# Patient Record
Sex: Female | Born: 1995 | Race: White | Hispanic: No | Marital: Single | Smoking: Never smoker
Health system: Southern US, Community
[De-identification: ages and names within clinical notes are randomized; demographics above are authoritative.]

## PROBLEM LIST (undated history)

## (undated) DIAGNOSIS — N390 Urinary tract infection, site not specified: Secondary | ICD-10-CM

## (undated) DIAGNOSIS — T7840XA Allergy, unspecified, initial encounter: Secondary | ICD-10-CM

## (undated) DIAGNOSIS — Z8489 Family history of other specified conditions: Secondary | ICD-10-CM

## (undated) DIAGNOSIS — H539 Unspecified visual disturbance: Secondary | ICD-10-CM

## (undated) DIAGNOSIS — B019 Varicella without complication: Secondary | ICD-10-CM

## (undated) HISTORY — DX: Unspecified visual disturbance: H53.9

## (undated) HISTORY — PX: ADENOIDECTOMY: SUR15

## (undated) HISTORY — DX: Family history of other specified conditions: Z84.89

## (undated) HISTORY — DX: Allergy, unspecified, initial encounter: T78.40XA

## (undated) HISTORY — DX: Varicella without complication: B01.9

## (undated) HISTORY — PX: TONSILLECTOMY: SUR1361

## (undated) HISTORY — DX: Urinary tract infection, site not specified: N39.0

---
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
## 2017-02-24 DIAGNOSIS — Z23 Encounter for immunization: Secondary | ICD-10-CM | POA: Diagnosis not present

## 2017-12-05 ENCOUNTER — Ambulatory Visit: Payer: 59 | Admitting: Family Medicine

## 2017-12-05 ENCOUNTER — Other Ambulatory Visit: Payer: Self-pay

## 2017-12-05 ENCOUNTER — Encounter: Payer: Self-pay | Admitting: Family Medicine

## 2017-12-05 VITALS — BP 112/70 | HR 79 | Temp 98.0°F | Resp 15 | Ht 69.0 in | Wt 179.4 lb

## 2017-12-05 DIAGNOSIS — H7292 Unspecified perforation of tympanic membrane, left ear: Secondary | ICD-10-CM

## 2017-12-05 DIAGNOSIS — H6690 Otitis media, unspecified, unspecified ear: Secondary | ICD-10-CM | POA: Diagnosis not present

## 2017-12-05 DIAGNOSIS — H6121 Impacted cerumen, right ear: Secondary | ICD-10-CM | POA: Diagnosis not present

## 2017-12-05 MED ORDER — AMOXICILLIN 875 MG PO TABS: 875.0000 mg | ORAL_TABLET | Freq: Two times a day (BID) | ORAL | 0 refills | Status: AC

## 2017-12-05 NOTE — Patient Instructions (Signed)
Please follow up if symptoms do not improve or as needed.   

## 2017-12-05 NOTE — Progress Notes (Signed)
Subjective  CC:  Chief Complaint  Patient presents with  . Ear Pain    Right ear pain, started about 2 weeks ago, worse now, has ear congestion and pressure    HPI: Shannon Chan is a 22 y.o. female who presents to the office today to address the problems listed above in the chief complaint.  Pleasant 22 year old complains of fullness and pain in her right ear off-and-on for the last 2 weeks but worsening.  No drainage.  Mild nasal congestion, no fevers or sore throat.  Leaving for college tomorrow.  No nausea vomiting diarrhea or malaise.  Assessment  1. Acute otitis media, unspecified otitis media type   2. Perforated tympanic membrane, post-infectious, left   3. 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  Treat with amoxicillin twice a day for a week.  Advil and antihistamine as needed.  Follow-up with ENT. Return  No orders of the defined types were placed in this encounter.  Meds ordered this encounter  Medications  . amoxicillin (AMOXIL) 875 MG tablet    Sig: Take 1 tablet (875 mg total) by mouth 2 (two) times daily for 7 days.    Dispense:  14 tablet    Refill:  0      I reviewed the patients updated PMH, FH, and SocHx.    Patient Active Problem List   Diagnosis Date Noted  . Raynaud phenomenon 06/13/2015   Current Meds  Medication Sig  . benzoyl peroxide 5 % gel Apply topically daily.  . Biotin w/ Vitamins C & E (HAIR/SKIN/NAILS PO) Take 1 capsule by mouth daily.  . MULTIPLE VITAMIN PO Take 1 capsule by mouth daily.  . Probiotic Product (PROBIOTIC-10) CAPS Take 1 capsule by mouth daily.    Allergies: Patient is allergic to cephalosporins. Family History: Patient family history is not on file. Social History:  Patient  reports that she has never smoked. She has never used smokeless tobacco. She reports that she does not drink alcohol or use drugs.  Review of  Systems: Constitutional: Negative for fever malaise or anorexia Cardiovascular: negative for chest pain Respiratory: negative for SOB or persistent cough Gastrointestinal: negative for abdominal pain  Objective  Vitals: BP 112/70   Pulse 79   Temp 98 F (36.7 C) (Oral)   Resp 15   Ht 5\' 9"  (1.753 m)   Wt 179 lb 6.4 oz (81.4 kg)   SpO2 99%   BMI 26.49 kg/m  General: no acute distress , A&Ox3 HEENT: PEERL, conjunctiva normal, Oropharynx moist,neck is supple Left TM normal-appearing, right TM obstructed by large amount of wax.  Lighted curette uses to manually extract large cerumen ball without complication.  TM shows chronic perforation with inferior opacity and surrounding erythema, no drainage noted.  External auditory canal normal. Cardiovascular:  RRR without murmur or gallop.  Respiratory:  Good breath sounds bilaterally, CTAB with normal respiratory effort Skin:  Warm, no rashes Ceruminosis is noted.  Wax is removed by curette and manual debridement. Wax impaired visualization of membrane. Wax is brown and hard. Instructions for home care to prevent wax buildup are given.     Commons side effects, risks, benefits, and alternatives for medications and treatment plan prescribed today were discussed, and the patient expressed understanding of the given instructions. Patient is instructed to call or message via MyChart if he/she has any questions or concerns regarding our treatment plan.  No barriers to understanding were identified. We discussed Red Flag symptoms and signs in detail. Patient expressed understanding regarding what to do in case of urgent or emergency type symptoms.   Medication list was reconciled, printed and provided to the patient in AVS. Patient instructions and summary information was reviewed with the patient as documented in the AVS. This note was prepared with assistance of Dragon voice recognition software. Occasional wrong-word or sound-a-like substitutions  may have occurred due to the inherent limitations of voice recognition software

## 2018-03-20 ENCOUNTER — Encounter: Payer: Self-pay | Admitting: Family Medicine

## 2018-03-20 ENCOUNTER — Ambulatory Visit (INDEPENDENT_AMBULATORY_CARE_PROVIDER_SITE_OTHER): Payer: BLUE CROSS/BLUE SHIELD | Admitting: Family Medicine

## 2018-03-20 VITALS — BP 120/70 | HR 75 | Temp 98.2°F | Ht 69.0 in | Wt 185.5 lb

## 2018-03-20 DIAGNOSIS — N6310 Unspecified lump in the right breast, unspecified quadrant: Secondary | ICD-10-CM | POA: Diagnosis not present

## 2018-03-20 NOTE — Progress Notes (Addendum)
0 Patient ID: Shannon Chan, female    DOB: 1996-02-20  Age: 22 y.o. MRN: [id number]    Subjective:  Subjective  HPI Shannon Chan presents for lump palpated in R breast.   Non tender   Review of Systems  Constitutional: Negative for appetite change, diaphoresis, fatigue and unexpected weight change.  Eyes: Negative for pain, redness and visual disturbance.  Respiratory: Negative for cough, chest tightness, shortness of breath and wheezing.   Cardiovascular: Negative for chest pain, palpitations and leg swelling.  Endocrine: Negative for cold intolerance, heat intolerance, polydipsia, polyphagia and polyuria.  Genitourinary: Negative for difficulty urinating, dysuria and frequency.  Neurological: Negative for dizziness, light-headedness, numbness and headaches.    History Past Medical History:  Diagnosis Date  . Allergy   . Family history of adverse reaction to anesthesia   . Urinary tract infection   . Varicella   . Vision abnormalities    Wears glasses    She has a past surgical history that includes Adenoidectomy and Tonsillectomy.   Her family history is not on file.She reports that she has never smoked. She has never used smokeless tobacco. She reports that she does not drink alcohol or use drugs.  Current Outpatient Medications on File Prior to Visit  Medication Sig Dispense Refill  . benzoyl peroxide 5 % gel Apply topically daily.    . Biotin w/ Vitamins C & E (HAIR/SKIN/NAILS PO) Take 1 capsule by mouth daily.    . drospirenone-ethinyl estradiol (YAZ,GIANVI,LORYNA) 3-0.02 MG tablet TK 1 T PO  QD  2  . MULTIPLE VITAMIN PO Take 1 capsule by mouth daily.    . Probiotic Product (PROBIOTIC-10) CAPS Take 1 capsule by mouth daily.     No current facility-administered medications on file prior to visit.      Objective:  Objective  Physical Exam Vitals signs and nursing note reviewed.  Constitutional:      Appearance: She is well-developed and well-nourished.    HENT:     Head: Normocephalic and atraumatic.  Neck:     Vascular: No carotid bruit.  Pulmonary:     Effort: Pulmonary effort is normal. No respiratory distress.  Chest:     Breasts:        Right: Mass present. No inverted nipple, nipple discharge, skin change or tenderness.        Left: No inverted nipple, mass, nipple discharge, skin change or tenderness.    Musculoskeletal:        General: No edema.  Neurological:     Mental Status: She is alert and oriented to person, place, and time.  Psychiatric:        Mood and Affect: Mood and affect normal.    BP 120/70 (BP Location: Left Arm, Patient Position: Sitting, Cuff Size: Normal)   Pulse 75   Temp 98.2 F (36.8 C) (Oral)   Ht 5\' 9"  (1.753 m)   Wt 185 lb 8 oz (84.1 kg)   LMP 03/11/2018   SpO2 99%   BMI 27.39 kg/m  Wt Readings from Last 3 Encounters:  03/20/18 185 lb 8 oz (84.1 kg)  12/05/17 179 lb 6.4 oz (81.4 kg) (94 %, Z= 1.58)*  03/07/16 194 lb 6 oz (88.2 kg) (97 %, Z= 1.90)*   * Growth percentiles are based on CDC (Girls, 2-20 Years) data.     Lab Results  Component Value Date   WBC 6.0 06/13/2015   HGB 11.3 (L) 06/13/2015   HCT 34.3 (L) 06/13/2015  PLT 406.0 06/13/2015   GLUCOSE 86 06/13/2015   NA 138 06/13/2015   K 3.8 06/13/2015   CL 106 06/13/2015   CREATININE 0.67 06/13/2015   BUN 8 06/13/2015   CO2 25 06/13/2015   TSH 2.86 06/13/2015    [place] Abdomen Complete  Result Date: 12/09/2011 *RADIOLOGY REPORT* Clinical Data:  Left upper abdominal and flank pain. COMPLETE ABDOMINAL ULTRASOUND Comparison:  None. Findings: Gallbladder:  No shadowing gallstones or echogenic sludge.  No gallbladder wall thickening or pericholecystic fluid.  Negative sonographic Murphy's sign according to the ultrasound technologist. Common bile duct:  3.8 mm diameter, unremarkable Liver:  No focal lesion identified.  Within normal limits in parenchymal echogenicity. IVC:  Appears normal. Pancreas:  No focal abnormality seen.  Spleen:  8.5 cm craniocaudal length, unremarkable Right Kidney:  10.9 cm. No hydronephrosis.  Well-preserved cortex. Normal size and parenchymal echotexture without focal abnormalities. Left Kidney:  10.7 cm. No hydronephrosis.  Well-preserved cortex. Normal size and parenchymal echotexture without focal abnormalities. Abdominal aorta:  No aneurysm identified. IMPRESSION: Negative.  Normal gallbladder. Original Report Authenticated By: Shannon Chan. DANIEL HASSELL III, M.D.     Assessment & Plan:  Plan  I am having Erva L. Vanyo maintain her benzoyl peroxide, MULTIPLE VITAMIN PO, PROBIOTIC-10, Biotin w/ Vitamins C & E (HAIR/SKIN/NAILS PO), and drospirenone-ethinyl estradiol.  No orders of the defined types were placed in this encounter.   Problem List Items Addressed This Visit    None    Visit Diagnoses    Mass of right breast    -  Primary   Relevant Orders   MM Digital Diagnostic Bilat   US BREAST COMPLETE UNI RIGHT INC AXILLA    f/u prn  Pt was concerned and mother was a nurse and was very concerned because there is a lot of breast cancer in the family  Pt would like to take prescription to college to have mammogram done there Donato SchultzYvonne R Lowne Chase, DO

## 2018-03-20 NOTE — Patient Instructions (Signed)
Mammogram order was printed for you to have when you go back to college

## 2018-03-23 ENCOUNTER — Telehealth: Payer: Self-pay

## 2018-03-23 NOTE — Telephone Encounter (Signed)
Copied from CRM [id number]. Topic: Quick Conservator, museum/galleryCommunication - Office Called Patient (Clinic Use ONLY) >> Mar 23, 2018  9:48 AM Caryl BisLea, Erica M wrote: Reason for CRM: 12/2- LM asking pt to call back to give us the location she would like the mammo and US sent or would she like to wait until she comes back home for Christmas break.Marland Kitchen.ELEA >> Mar 23, 2018 10:08 AM Trula SladeWalter, Linda F wrote: Patient left the information for the [facility] Ph: [phone]  Fax: [phone].  Please send the referral over and the patient can then make the appt for the procedures.

## 2018-03-24 ENCOUNTER — Encounter: Payer: Self-pay | Admitting: Family Medicine

## 2018-03-24 ENCOUNTER — Telehealth: Payer: Self-pay | Admitting: Family Medicine

## 2018-03-24 DIAGNOSIS — N6310 Unspecified lump in the right breast, unspecified quadrant: Secondary | ICD-10-CM

## 2018-03-24 DIAGNOSIS — N6315 Unspecified lump in the right breast, overlapping quadrants: Secondary | ICD-10-CM

## 2018-03-24 NOTE — Telephone Encounter (Signed)
Please change order  

## 2018-03-24 NOTE — Telephone Encounter (Signed)
[facility].Orders were placed for the pt to have a US Breast Comp.   [facility] need [facility] order changed to Diagnostic breast ultrasound bilateral. Could you please change these in Epic for Her appt is tomorrow at 2pm. Please advise and please fax the order to [phone].

## 2018-03-25 DIAGNOSIS — N6313 Unspecified lump in the right breast, lower outer quadrant: Secondary | ICD-10-CM | POA: Diagnosis not present

## 2018-03-25 NOTE — Telephone Encounter (Signed)
Shannon Chan is calling with a  Request to have to orders placed for the patient before lunch. The patient has a appointment 1pm. Please advise.   Direct Contact number: [phone] [facility] fax number  [phone]

## 2018-03-25 NOTE — Addendum Note (Signed)
Addended by: Thelma BargeICHARDSON, Tarrin Menn D on: 03/25/2018 11:08 AM   Modules accepted: Orders

## 2018-03-25 NOTE — Telephone Encounter (Signed)
[facility] from [facility] called again and stated that they need order to say diagnostic mammogram bilateral with US breast bilateral and biopsy if needed.  Order entered again and [facility]ed to [facility].

## 2018-03-25 NOTE — Telephone Encounter (Signed)
Called office and they stated that they need the order to say Diagnostic Breast US bilateral and BX if needed.  Order faxed over to radiology.

## 2018-04-08 ENCOUNTER — Ambulatory Visit (INDEPENDENT_AMBULATORY_CARE_PROVIDER_SITE_OTHER): Payer: BLUE CROSS/BLUE SHIELD | Admitting: Family Medicine

## 2018-04-08 ENCOUNTER — Other Ambulatory Visit: Payer: Self-pay

## 2018-04-08 ENCOUNTER — Encounter: Payer: Self-pay | Admitting: Family Medicine

## 2018-04-08 VITALS — BP 106/72 | HR 68 | Temp 98.2°F | Resp 16 | Ht 69.0 in | Wt 183.2 lb

## 2018-04-08 DIAGNOSIS — E663 Overweight: Secondary | ICD-10-CM | POA: Diagnosis not present

## 2018-04-08 DIAGNOSIS — Z0000 Encounter for general adult medical examination without abnormal findings: Secondary | ICD-10-CM

## 2018-04-08 DIAGNOSIS — Z23 Encounter for immunization: Secondary | ICD-10-CM

## 2018-04-08 LAB — HEPATIC FUNCTION PANEL
ALK PHOS: 35 U/L — AB (ref 39–117)
ALT: 14 U/L (ref 0–35)
AST: 15 U/L (ref 0–37)
Albumin: 4.1 g/dL (ref 3.5–5.2)
Bilirubin, Direct: 0.1 mg/dL (ref 0.0–0.3)
Total Bilirubin: 0.3 mg/dL (ref 0.2–1.2)
Total Protein: 7 g/dL (ref 6.0–8.3)

## 2018-04-08 LAB — BASIC METABOLIC PANEL
BUN: 13 mg/dL (ref 6–23)
CO2: 25 mEq/L (ref 19–32)
Calcium: 9.3 mg/dL (ref 8.4–10.5)
Chloride: 106 mEq/L (ref 96–112)
Creatinine, Ser: 0.81 mg/dL (ref 0.40–1.20)
GFR: 95.57 mL/min (ref >60.00)
Glucose, Bld: 82 mg/dL (ref 70–99)
Potassium: 4 mEq/L (ref 3.5–5.1)
Sodium: 139 mEq/L (ref 135–145)

## 2018-04-08 LAB — LIPID PANEL
Cholesterol: 147 mg/dL (ref 0–200)
HDL: 55.6 mg/dL (ref >39.00)
LDL Cholesterol: 79 mg/dL (ref 0–99)
NonHDL: 91.14
Total CHOL/HDL Ratio: 3
Triglycerides: 60 mg/dL (ref 0.0–149.0)
VLDL: 12 mg/dL (ref 0.0–40.0)

## 2018-04-08 LAB — CBC WITH DIFFERENTIAL/PLATELET
Basophils Absolute: 0.1 10*3/uL (ref 0.0–0.1)
Basophils Relative: 0.5 % (ref 0.0–3.0)
Eosinophils Absolute: 0 10*3/uL (ref 0.0–0.7)
Eosinophils Relative: 0.3 % (ref 0.0–5.0)
HCT: 39.6 % (ref 36.0–46.0)
HEMOGLOBIN: 13.3 g/dL (ref 12.0–15.0)
Lymphocytes Relative: 28.8 % (ref 12.0–46.0)
Lymphs Abs: 3 10*3/uL (ref 0.7–4.0)
MCHC: 33.7 g/dL (ref 30.0–36.0)
MCV: 89.5 fl (ref 78.0–100.0)
Monocytes Absolute: 0.8 10*3/uL (ref 0.1–1.0)
Monocytes Relative: 7.3 % (ref 3.0–12.0)
Neutro Abs: 6.5 10*3/uL (ref 1.4–7.7)
Neutrophils Relative %: 63.1 % (ref 43.0–77.0)
Platelets: 375 10*3/uL (ref 150.0–400.0)
RBC: 4.43 Mil/uL (ref 3.87–5.11)
RDW: 13 % (ref 11.5–14.6)
WBC: 10.3 10*3/uL (ref 4.5–10.5)

## 2018-04-08 LAB — TSH: TSH: 1.73 u[IU]/mL (ref 0.35–5.50)

## 2018-04-08 NOTE — Assessment & Plan Note (Signed)
Pt's PE WNL w/ exception of being overweight.  UTD on immunizations since flu given today.  Check labs as baseline.  Anticipatory guidance provided.

## 2018-04-08 NOTE — Patient Instructions (Signed)
Follow up in 1 year or as needed We'll notify you of your lab results and make any changes if needed Keep up the good work on healthy diet and regular exercise- you look great! Congrats on a good semester! Call with any questions or concerns Happy Holidays!!!

## 2018-04-08 NOTE — Progress Notes (Signed)
   Subjective:    Patient ID: Shannon Chan, female    DOB: Sep 13, 1995, 22 y.o.   MRN: [id number]  HPI CPE- UTD on Tdap, due for flu.  No concerns today.  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  Dating.  Safe in relationship.  No concerns for STIs.  No ETOH or drug use.  No tobacco.   Review of Systems Patient reports no vision/ hearing changes, adenopathy,fever, weight change,  persistant/recurrent hoarseness , swallowing issues, chest pain, palpitations, edema, persistant/recurrent cough, hemoptysis, dyspnea (rest/exertional/paroxysmal nocturnal), gastrointestinal bleeding (melena, rectal bleeding), abdominal pain, significant heartburn, bowel changes, GU symptoms (dysuria, hematuria, incontinence), Gyn symptoms (abnormal  bleeding, pain),  syncope, focal weakness, memory loss, numbness & tingling, skin/hair/nail changes, abnormal bruising or bleeding, anxiety, or depression.     Objective:   Physical Exam General Appearance:    Alert, cooperative, no distress, appears stated age  Head:    Normocephalic, without obvious abnormality, atraumatic  Eyes:    PERRL, conjunctiva/corneas clear, EOM's intact, fundi    benign, both eyes  Ears:    Normal TM's and external ear canals, both ears  Nose:   Nares normal, septum midline, mucosa normal, no drainage    or sinus tenderness  Throat:   Lips, mucosa, and tongue normal; teeth and gums normal  Neck:   Supple, symmetrical, trachea midline, no adenopathy;    Thyroid: no enlargement/tenderness/nodules  Back:     Symmetric, no curvature, ROM normal, no CVA tenderness  Lungs:     Clear to auscultation bilaterally, respirations unlabored  Chest Wall:    No tenderness or deformity   Heart:    Regular rate and rhythm, S1 and S2 normal, no murmur, rub   or gallop  Breast Exam:    Deferred to GYN  Abdomen:     Soft, non-tender, bowel sounds active all four quadrants,    no masses, no organomegaly  Genitalia:     Deferred to GYN  Rectal:    Extremities:   Extremities normal, atraumatic, no cyanosis or edema  Pulses:   2+ and symmetric all extremities  Skin:   Skin color, texture, turgor normal, no rashes or lesions  Lymph nodes:   Cervical, supraclavicular, and axillary nodes normal  Neurologic:   CNII-XII intact, normal strength, sensation and reflexes    throughout          Assessment & Plan:

## 2018-04-09 ENCOUNTER — Encounter: Payer: Self-pay | Admitting: General Practice

## 2018-04-13 ENCOUNTER — Encounter: Payer: Self-pay | Admitting: Family Medicine

## 2018-04-13 DIAGNOSIS — L710 Perioral dermatitis: Secondary | ICD-10-CM | POA: Diagnosis not present

## 2018-04-13 DIAGNOSIS — L700 Acne vulgaris: Secondary | ICD-10-CM | POA: Diagnosis not present

## 2018-04-13 MED ORDER — DROSPIRENONE-ETHINYL ESTRADIOL 3-0.02 MG PO TABS: ORAL_TABLET | ORAL | 1 refills | Status: DC

## 2018-06-20 DIAGNOSIS — M67929 Unspecified disorder of synovium and tendon, unspecified upper arm: Secondary | ICD-10-CM | POA: Diagnosis not present

## 2018-06-20 DIAGNOSIS — M67921 Unspecified disorder of synovium and tendon, right upper arm: Secondary | ICD-10-CM | POA: Diagnosis not present

## 2018-09-30 DIAGNOSIS — L2089 Other atopic dermatitis: Secondary | ICD-10-CM | POA: Diagnosis not present

## 2018-09-30 DIAGNOSIS — D485 Neoplasm of uncertain behavior of skin: Secondary | ICD-10-CM | POA: Diagnosis not present

## 2018-09-30 DIAGNOSIS — L905 Scar conditions and fibrosis of skin: Secondary | ICD-10-CM | POA: Diagnosis not present

## 2018-10-08 ENCOUNTER — Other Ambulatory Visit: Payer: Self-pay | Admitting: Family Medicine

## 2018-10-22 ENCOUNTER — Telehealth: Payer: Self-pay | Admitting: Family Medicine

## 2018-10-22 NOTE — Telephone Encounter (Signed)
Ok for in-office visit if passing screening just in case pelvic exam is warranted.

## 2018-10-22 NOTE — Telephone Encounter (Signed)
Please advise on an in office visit or a VV    Copied from [facility] [id number]. Topic: Appointment Scheduling - Scheduling Inquiry for Clinic >> Oct 21, 2018  4:11 PM Celene Kras A wrote: Reason for CRM: Pt states she experienced some irregular period bleeding mid cycle. Pt states she would like a pelvic exam. Please advise.

## 2018-10-26 NOTE — Telephone Encounter (Signed)
Patient passed screening. Appointment has been scheduled.

## 2018-10-27 ENCOUNTER — Other Ambulatory Visit (HOSPITAL_COMMUNITY)
Admission: RE | Admit: 2018-10-27 | Discharge: 2018-10-27 | Disposition: A | Discharge status: Home / Self Care | Payer: BC Managed Care – PPO | Source: Ambulatory Visit | Attending: Family Medicine | Admitting: Family Medicine

## 2018-10-27 ENCOUNTER — Other Ambulatory Visit: Payer: Self-pay

## 2018-10-27 ENCOUNTER — Encounter: Payer: Self-pay | Admitting: Family Medicine

## 2018-10-27 ENCOUNTER — Ambulatory Visit (INDEPENDENT_AMBULATORY_CARE_PROVIDER_SITE_OTHER): Payer: BC Managed Care – PPO | Admitting: Family Medicine

## 2018-10-27 VITALS — BP 107/78 | HR 103 | Temp 98.0°F | Resp 16 | Ht 69.0 in | Wt 167.0 lb

## 2018-10-27 DIAGNOSIS — H938X1 Other specified disorders of right ear: Secondary | ICD-10-CM | POA: Diagnosis not present

## 2018-10-27 DIAGNOSIS — N923 Ovulation bleeding: Secondary | ICD-10-CM

## 2018-10-27 LAB — POCT URINALYSIS DIPSTICK
Bilirubin, UA: NEGATIVE
Blood, UA: NEGATIVE
Glucose, UA: NEGATIVE
Leukocytes, UA: NEGATIVE
Nitrite, UA: NEGATIVE
Protein, UA: POSITIVE — AB
Spec Grav, UA: 1.02 (ref 1.010–1.025)
Urobilinogen, UA: 0.2 E.U./dL
pH, UA: 6 (ref 5.0–8.0)

## 2018-10-27 LAB — POCT URINE PREGNANCY: Preg Test, Ur: NEGATIVE

## 2018-10-27 NOTE — Progress Notes (Signed)
   Subjective:    Patient ID: Shannon Chan, female    DOB: [birth year], 23 y.o.   MRN: [id number]  HPI Vaginal concerns- pt reports she has 'very regular' periods but this last cycle she had 5 days of mid cycle bleeding w/ severe cramping.  Continues to take her Yaz daily.  Was discussing this w/ a friend and friend mentioned similar sxs that resulted in + STD screening.  Pt is currently sexually active.  Not using condoms.  Not missing any pills.  LMP 7/1.  Mid cycle bleeding was 6/17-21.  Denies any changes to medications or stress level at that time but does think she had yeast prior to bleeding.  Currently asymptomatic- no pain or discharge.  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  Has had 'congestion' and 'fullness' [place].  Denies pain or drainage.   Review of Systems For ROS see HPI     Objective:   Physical Exam Vitals signs reviewed.  Constitutional:      General: She is not in acute distress.    Appearance: Normal appearance. She is normal weight. She is not ill-appearing.  HENT:     Head: Normocephalic and atraumatic.     Right Ear: Ear canal and external ear normal.     Ears:     Comments: R TM w/ medial perforation, no obvious infxn Abdominal:     General: Abdomen is flat. Bowel sounds are normal. There is no distension.     Palpations: Abdomen is soft.     Tenderness: There is no abdominal tenderness. There is no guarding.  Genitourinary:    Comments: Deferred at pt's request Skin:    General: Skin is warm and dry.  Neurological:     General: No focal deficit present.     Mental Status: She is alert and oriented to person, place, and time.  Psychiatric:        Mood and Affect: Mood normal.        Behavior: Behavior normal.        Thought Content: Thought content normal.           Assessment & Plan:  Vaginal bleeding- new.  Pt is very concerned about her mid-menstrual bleeding as she  is typically very regular.  Had normal period on schedule.  Denies missing or skipping pills.  Currently asymptomatic.  Will check for STDs as pt is concerned about this.  Discussed impact of stress, weight gain/loss, medications on menstrual cycles.  Pt to continue to monitor her cycles.  If again has abnormal bleeding will do pelvic exam and/or refer to GYN.  Pt expressed understanding and is in agreement w/ plan.   Ear fullness- no evidence of infxn.  Reviewed treatment plan- pt to start wearing ear plugs and avoid putting anything in the ear.

## 2018-10-27 NOTE — Patient Instructions (Signed)
Follow up as needed We'll notify you of your lab results and make any changes if needed Continue to monitor your periods and let me know if there is again any irregular bleeding Continue your daily birth control pills Make sure you are wearing condoms to protect yourself Do NOT put any alcohol in your ear Wear ear plugs when you swim Call with any questions or concerns Stay safe!

## 2018-10-28 LAB — URINE CYTOLOGY ANCILLARY ONLY
Chlamydia: NEGATIVE
Neisseria Gonorrhea: NEGATIVE
Trichomonas: NEGATIVE

## 2018-10-29 LAB — URINE CYTOLOGY ANCILLARY ONLY
Bacterial vaginitis: NEGATIVE
Candida vaginitis: NEGATIVE

## 2018-11-16 ENCOUNTER — Encounter: Payer: Self-pay | Admitting: Family Medicine

## 2018-11-17 ENCOUNTER — Other Ambulatory Visit: Payer: Self-pay | Admitting: Family Medicine

## 2018-11-17 DIAGNOSIS — Z01419 Encounter for gynecological examination (general) (routine) without abnormal findings: Secondary | ICD-10-CM

## 2018-12-02 ENCOUNTER — Encounter: Payer: Self-pay | Admitting: Family Medicine

## 2018-12-04 MED ORDER — NORGESTIMATE-ETH ESTRADIOL 0.25-35 MG-MCG PO TABS: 1.0000 | ORAL_TABLET | Freq: Every day | ORAL | 11 refills | Status: DC

## 2019-01-07 ENCOUNTER — Encounter: Payer: Self-pay | Admitting: Family Medicine

## 2019-01-27 DIAGNOSIS — F4323 Adjustment disorder with mixed anxiety and depressed mood: Secondary | ICD-10-CM | POA: Diagnosis not present

## 2019-02-03 DIAGNOSIS — F4323 Adjustment disorder with mixed anxiety and depressed mood: Secondary | ICD-10-CM | POA: Diagnosis not present

## 2019-02-10 DIAGNOSIS — F4323 Adjustment disorder with mixed anxiety and depressed mood: Secondary | ICD-10-CM | POA: Diagnosis not present

## 2019-02-17 DIAGNOSIS — F4323 Adjustment disorder with mixed anxiety and depressed mood: Secondary | ICD-10-CM | POA: Diagnosis not present

## 2019-02-24 DIAGNOSIS — F4323 Adjustment disorder with mixed anxiety and depressed mood: Secondary | ICD-10-CM | POA: Diagnosis not present

## 2019-03-03 DIAGNOSIS — F4323 Adjustment disorder with mixed anxiety and depressed mood: Secondary | ICD-10-CM | POA: Diagnosis not present

## 2019-03-07 DIAGNOSIS — R52 Pain, unspecified: Secondary | ICD-10-CM | POA: Diagnosis not present

## 2019-03-07 DIAGNOSIS — R519 Headache, unspecified: Secondary | ICD-10-CM | POA: Diagnosis not present

## 2019-03-07 DIAGNOSIS — Z20828 Contact with and (suspected) exposure to other viral communicable diseases: Secondary | ICD-10-CM | POA: Diagnosis not present

## 2019-03-07 DIAGNOSIS — R0981 Nasal congestion: Secondary | ICD-10-CM | POA: Diagnosis not present

## 2019-03-12 DIAGNOSIS — F4323 Adjustment disorder with mixed anxiety and depressed mood: Secondary | ICD-10-CM | POA: Diagnosis not present

## 2019-03-24 DIAGNOSIS — F419 Anxiety disorder, unspecified: Secondary | ICD-10-CM | POA: Diagnosis not present

## 2019-03-30 DIAGNOSIS — F419 Anxiety disorder, unspecified: Secondary | ICD-10-CM | POA: Diagnosis not present

## 2019-04-05 DIAGNOSIS — F419 Anxiety disorder, unspecified: Secondary | ICD-10-CM | POA: Diagnosis not present

## 2019-04-12 ENCOUNTER — Other Ambulatory Visit: Payer: Self-pay

## 2019-04-12 ENCOUNTER — Ambulatory Visit (INDEPENDENT_AMBULATORY_CARE_PROVIDER_SITE_OTHER): Payer: BC Managed Care – PPO | Admitting: Family Medicine

## 2019-04-12 ENCOUNTER — Encounter: Payer: Self-pay | Admitting: Family Medicine

## 2019-04-12 VITALS — BP 118/72 | HR 78 | Temp 98.2°F | Resp 16 | Ht 69.0 in | Wt 160.4 lb

## 2019-04-12 DIAGNOSIS — Z0000 Encounter for general adult medical examination without abnormal findings: Secondary | ICD-10-CM

## 2019-04-12 DIAGNOSIS — R5383 Other fatigue: Secondary | ICD-10-CM | POA: Diagnosis not present

## 2019-04-12 DIAGNOSIS — Z23 Encounter for immunization: Secondary | ICD-10-CM

## 2019-04-12 LAB — HEPATIC FUNCTION PANEL
ALT: 11 U/L (ref 0–35)
AST: 14 U/L (ref 0–37)
Albumin: 4 g/dL (ref 3.5–5.2)
Alkaline Phosphatase: 50 U/L (ref 39–117)
Bilirubin, Direct: 0.1 mg/dL (ref 0.0–0.3)
Total Bilirubin: 0.4 mg/dL (ref 0.2–1.2)
Total Protein: 6.4 g/dL (ref 6.0–8.3)

## 2019-04-12 LAB — BASIC METABOLIC PANEL
BUN: 15 mg/dL (ref 6–23)
CO2: 25 mEq/L (ref 19–32)
Calcium: 9.3 mg/dL (ref 8.4–10.5)
Chloride: 104 mEq/L (ref 96–112)
Creatinine, Ser: 0.81 mg/dL (ref 0.40–1.20)
GFR: 89.03 mL/min (ref >60.00)
Glucose, Bld: 80 mg/dL (ref 70–99)
Potassium: 4.2 mEq/L (ref 3.5–5.1)
Sodium: 138 mEq/L (ref 135–145)

## 2019-04-12 LAB — TSH: TSH: 1.86 u[IU]/mL (ref 0.35–4.50)

## 2019-04-12 LAB — CBC WITH DIFFERENTIAL/PLATELET
Basophils Absolute: 0 10*3/uL (ref 0.0–0.1)
Basophils Relative: 0.7 % (ref 0.0–3.0)
Eosinophils Absolute: 0 10*3/uL (ref 0.0–0.7)
Eosinophils Relative: 0.6 % (ref 0.0–5.0)
HCT: 36.2 % (ref 36.0–46.0)
Hemoglobin: 11.7 g/dL — ABNORMAL LOW (ref 12.0–15.0)
Lymphocytes Relative: 29.7 % (ref 12.0–46.0)
Lymphs Abs: 1.9 10*3/uL (ref 0.7–4.0)
MCHC: 32.5 g/dL (ref 30.0–36.0)
MCV: 90.1 fl (ref 78.0–100.0)
Monocytes Absolute: 0.5 10*3/uL (ref 0.1–1.0)
Monocytes Relative: 7.8 % (ref 3.0–12.0)
Neutro Abs: 3.8 10*3/uL (ref 1.4–7.7)
Neutrophils Relative %: 61.2 % (ref 43.0–77.0)
Platelets: 410 10*3/uL — ABNORMAL HIGH (ref 150.0–400.0)
RBC: 4.02 Mil/uL (ref 3.87–5.11)
RDW: 14.2 % (ref 11.5–15.5)
WBC: 6.3 10*3/uL (ref 4.0–10.5)

## 2019-04-12 LAB — VITAMIN D 25 HYDROXY (VIT D DEFICIENCY, FRACTURES): VITD: 55.92 ng/mL (ref 30.00–100.00)

## 2019-04-12 NOTE — Assessment & Plan Note (Signed)
Pt's PE WNL.  UTD on flu.  Due for Tdap- done today.  Anticipatory guidance provided.

## 2019-04-12 NOTE — Progress Notes (Signed)
   Subjective:    Patient ID: Shannon Chan, female    DOB: [birth year], 23 y.o.   MRN: [id number]  HPI CPE- UTD on flu.  Due for Tdap and pap (needs to call GYN).  Home for winter break from [place]- returns Jan 10th.  School requires negative COVID test.  Semester went well- studying biology w/ concentration in bio-med science.  Not currently in a relationship.  No concerns for STIs.  No regular tobacco use.  No drug use.  Occasional alcohol.   Review of Systems Patient reports no vision/ hearing changes, adenopathy,fever, weight change,  persistant/recurrent hoarseness , swallowing issues, chest pain, palpitations, edema, persistant/recurrent cough, hemoptysis, dyspnea (rest/exertional/paroxysmal nocturnal), gastrointestinal bleeding (melena, rectal bleeding), abdominal pain, significant heartburn, bowel changes, GU symptoms (dysuria, hematuria, incontinence), Gyn symptoms (abnormal  bleeding, pain),  syncope, focal weakness, memory loss, numbness & tingling, skin/hair/nail changes, abnormal bruising or bleeding.  Started counseling for anxiety + fatigue   This visit occurred during the SARS-CoV-2 public health emergency.  Safety protocols were in place, including screening questions prior to the visit, additional usage of staff PPE, and extensive cleaning of exam room while observing appropriate contact time as indicated for disinfecting solutions.       Objective:   Physical Exam General Appearance:    Alert, cooperative, no distress, appears stated age  Head:    Normocephalic, without obvious abnormality, atraumatic  Eyes:    PERRL, conjunctiva/corneas clear, EOM's intact, fundi    benign, both eyes  Ears:    Normal TM's and external ear canals, both ears  Nose:   Deferred due to COVID  Throat:   Neck:   Supple, symmetrical, trachea midline, no adenopathy;    Thyroid: no enlargement/tenderness/nodules  Back:     Symmetric, no curvature, ROM normal, no CVA tenderness  Lungs:      Clear to auscultation bilaterally, respirations unlabored  Chest Wall:    No tenderness or deformity   Heart:    Regular rate and rhythm, S1 and S2 normal, no murmur, rub   or gallop  Breast Exam:    Deferred to GYN  Abdomen:     Soft, non-tender, bowel sounds active all four quadrants,    no masses, no organomegaly  Genitalia:    Deferred to GYN  Rectal:    Extremities:   Extremities normal, atraumatic, no cyanosis or edema  Pulses:   2+ and symmetric all extremities  Skin:   Skin color, texture, turgor normal, no rashes or lesions  Lymph nodes:   Cervical, supraclavicular, and axillary nodes normal  Neurologic:   CNII-XII intact, normal strength, sensation and reflexes    throughout          Assessment & Plan:  Fatigue- new.  Suspect this is multifactorial- busy semester, COVID stress, recent anxiety- but will need to check labs to r/o metabolic cause.  Will follow.

## 2019-04-12 NOTE — Patient Instructions (Signed)
Follow up in 1 year or as needed We'll notify you of your lab results and make any changes if needed Keep up the good work!  You look great! Call with any questions or concerns Stay Safe!  Stay Healthy! Merry Christmas!!

## 2019-04-12 NOTE — Addendum Note (Signed)
Addended by: Davis Gourd on: 04/12/2019 10:40 AM   Modules accepted: Orders

## 2019-04-14 DIAGNOSIS — F419 Anxiety disorder, unspecified: Secondary | ICD-10-CM | POA: Diagnosis not present

## 2019-04-21 DIAGNOSIS — F419 Anxiety disorder, unspecified: Secondary | ICD-10-CM | POA: Diagnosis not present

## 2019-04-27 DIAGNOSIS — Z20822 Contact with and (suspected) exposure to covid-19: Secondary | ICD-10-CM | POA: Diagnosis not present

## 2019-04-29 DIAGNOSIS — F419 Anxiety disorder, unspecified: Secondary | ICD-10-CM | POA: Diagnosis not present

## 2019-05-06 DIAGNOSIS — F419 Anxiety disorder, unspecified: Secondary | ICD-10-CM | POA: Diagnosis not present

## 2019-05-13 DIAGNOSIS — F419 Anxiety disorder, unspecified: Secondary | ICD-10-CM | POA: Diagnosis not present

## 2019-05-20 DIAGNOSIS — F419 Anxiety disorder, unspecified: Secondary | ICD-10-CM | POA: Diagnosis not present

## 2019-05-27 DIAGNOSIS — F419 Anxiety disorder, unspecified: Secondary | ICD-10-CM | POA: Diagnosis not present

## 2019-06-03 DIAGNOSIS — F419 Anxiety disorder, unspecified: Secondary | ICD-10-CM | POA: Diagnosis not present

## 2019-06-10 DIAGNOSIS — F419 Anxiety disorder, unspecified: Secondary | ICD-10-CM | POA: Diagnosis not present

## 2019-06-17 DIAGNOSIS — F419 Anxiety disorder, unspecified: Secondary | ICD-10-CM | POA: Diagnosis not present

## 2019-06-24 DIAGNOSIS — F419 Anxiety disorder, unspecified: Secondary | ICD-10-CM | POA: Diagnosis not present

## 2019-07-01 DIAGNOSIS — F419 Anxiety disorder, unspecified: Secondary | ICD-10-CM | POA: Diagnosis not present

## 2019-07-08 DIAGNOSIS — F419 Anxiety disorder, unspecified: Secondary | ICD-10-CM | POA: Diagnosis not present

## 2019-07-15 DIAGNOSIS — F419 Anxiety disorder, unspecified: Secondary | ICD-10-CM | POA: Diagnosis not present

## 2019-07-22 DIAGNOSIS — F419 Anxiety disorder, unspecified: Secondary | ICD-10-CM | POA: Diagnosis not present

## 2019-07-29 DIAGNOSIS — F419 Anxiety disorder, unspecified: Secondary | ICD-10-CM | POA: Diagnosis not present

## 2019-08-12 DIAGNOSIS — F419 Anxiety disorder, unspecified: Secondary | ICD-10-CM | POA: Diagnosis not present

## 2019-08-26 DIAGNOSIS — F419 Anxiety disorder, unspecified: Secondary | ICD-10-CM | POA: Diagnosis not present

## 2019-09-01 DIAGNOSIS — F419 Anxiety disorder, unspecified: Secondary | ICD-10-CM | POA: Diagnosis not present

## 2019-09-16 DIAGNOSIS — F419 Anxiety disorder, unspecified: Secondary | ICD-10-CM | POA: Diagnosis not present

## 2019-09-17 DIAGNOSIS — H60331 Swimmer's ear, right ear: Secondary | ICD-10-CM | POA: Diagnosis not present

## 2019-09-22 DIAGNOSIS — H6122 Impacted cerumen, left ear: Secondary | ICD-10-CM | POA: Diagnosis not present

## 2019-09-22 DIAGNOSIS — H9191 Unspecified hearing loss, right ear: Secondary | ICD-10-CM | POA: Diagnosis not present

## 2019-09-22 DIAGNOSIS — H7291 Unspecified perforation of tympanic membrane, right ear: Secondary | ICD-10-CM | POA: Diagnosis not present

## 2019-09-23 DIAGNOSIS — H9011 Conductive hearing loss, unilateral, right ear, with unrestricted hearing on the contralateral side: Secondary | ICD-10-CM | POA: Diagnosis not present

## 2019-09-23 DIAGNOSIS — H7291 Unspecified perforation of tympanic membrane, right ear: Secondary | ICD-10-CM | POA: Diagnosis not present

## 2019-09-28 ENCOUNTER — Other Ambulatory Visit: Payer: Self-pay | Admitting: General Practice

## 2019-09-28 MED ORDER — NORGESTIMATE-ETH ESTRADIOL 0.25-35 MG-MCG PO TABS: 1.0000 | ORAL_TABLET | Freq: Every day | ORAL | 11 refills | Status: DC

## 2019-09-30 DIAGNOSIS — F419 Anxiety disorder, unspecified: Secondary | ICD-10-CM | POA: Diagnosis not present

## 2019-10-14 DIAGNOSIS — F419 Anxiety disorder, unspecified: Secondary | ICD-10-CM | POA: Diagnosis not present

## 2019-11-03 DIAGNOSIS — F419 Anxiety disorder, unspecified: Secondary | ICD-10-CM | POA: Diagnosis not present

## 2019-11-10 DIAGNOSIS — F419 Anxiety disorder, unspecified: Secondary | ICD-10-CM | POA: Diagnosis not present

## 2019-12-01 DIAGNOSIS — F419 Anxiety disorder, unspecified: Secondary | ICD-10-CM | POA: Diagnosis not present

## 2019-12-02 DIAGNOSIS — H7201 Central perforation of tympanic membrane, right ear: Secondary | ICD-10-CM | POA: Diagnosis not present

## 2019-12-02 DIAGNOSIS — H73891 Other specified disorders of tympanic membrane, right ear: Secondary | ICD-10-CM | POA: Diagnosis not present

## 2019-12-02 DIAGNOSIS — H9011 Conductive hearing loss, unilateral, right ear, with unrestricted hearing on the contralateral side: Secondary | ICD-10-CM | POA: Diagnosis not present

## 2019-12-14 DIAGNOSIS — F419 Anxiety disorder, unspecified: Secondary | ICD-10-CM | POA: Diagnosis not present

## 2020-01-26 DIAGNOSIS — J069 Acute upper respiratory infection, unspecified: Secondary | ICD-10-CM | POA: Diagnosis not present

## 2020-01-26 DIAGNOSIS — Z20828 Contact with and (suspected) exposure to other viral communicable diseases: Secondary | ICD-10-CM | POA: Diagnosis not present

## 2020-01-26 DIAGNOSIS — R519 Headache, unspecified: Secondary | ICD-10-CM | POA: Diagnosis not present

## 2020-01-26 DIAGNOSIS — J0110 Acute frontal sinusitis, unspecified: Secondary | ICD-10-CM | POA: Diagnosis not present

## 2020-03-05 DIAGNOSIS — R300 Dysuria: Secondary | ICD-10-CM | POA: Diagnosis not present

## 2020-03-05 DIAGNOSIS — N390 Urinary tract infection, site not specified: Secondary | ICD-10-CM | POA: Diagnosis not present

## 2020-04-12 ENCOUNTER — Other Ambulatory Visit: Payer: Self-pay

## 2020-04-12 ENCOUNTER — Ambulatory Visit (INDEPENDENT_AMBULATORY_CARE_PROVIDER_SITE_OTHER): Payer: BC Managed Care – PPO | Admitting: Family Medicine

## 2020-04-12 ENCOUNTER — Encounter: Payer: Self-pay | Admitting: Family Medicine

## 2020-04-12 VITALS — BP 112/70 | HR 96 | Temp 99.0°F | Resp 17 | Ht 69.5 in | Wt 166.0 lb

## 2020-04-12 DIAGNOSIS — H7291 Unspecified perforation of tympanic membrane, right ear: Secondary | ICD-10-CM | POA: Insufficient documentation

## 2020-04-12 DIAGNOSIS — Z124 Encounter for screening for malignant neoplasm of cervix: Secondary | ICD-10-CM

## 2020-04-12 DIAGNOSIS — H9011 Conductive hearing loss, unilateral, right ear, with unrestricted hearing on the contralateral side: Secondary | ICD-10-CM | POA: Insufficient documentation

## 2020-04-12 DIAGNOSIS — Z23 Encounter for immunization: Secondary | ICD-10-CM

## 2020-04-12 DIAGNOSIS — Z0000 Encounter for general adult medical examination without abnormal findings: Secondary | ICD-10-CM | POA: Diagnosis not present

## 2020-04-12 NOTE — Assessment & Plan Note (Signed)
Pt's PE WNL w/ exception of known R TM perforation.  Due for pap- referral placed.  Flu shot given.  Encouraged her to schedule COVID vaccines.  Anticipatory guidance provided.

## 2020-04-12 NOTE — Progress Notes (Signed)
   Subjective:    Patient ID: Shannon Chan, female    DOB: 10/29/1995, 24 y.o.   MRN: [id number]  HPI CPE- UTD on Tdap.  Will get flu shot today.  Has not had COVID.  Due for pap- needs GYN referral.  No concerns today.  Reviewed past medical, surgical, family and social histories.   Health Maintenance  Topic Date Due  . Hepatitis C Screening  Never done  . HIV Screening  Never done  . INFLUENZA VACCINE  11/21/2019  . PAP SMEAR-Modifier  04/12/2020 (Originally 01/06/2019)  . COVID-19 Vaccine (1) 04/28/2020 (Originally 01/06/2003)  . PAP-Cervical Cytology Screening  04/12/2021 (Originally 01/06/2019)  . TETANUS/TDAP  04/11/2029      Review of Systems Patient reports no vision/ hearing changes, adenopathy,fever, weight change,  persistant/recurrent hoarseness , swallowing issues, chest pain, palpitations, edema, persistant/recurrent cough, hemoptysis, dyspnea (rest/exertional/paroxysmal nocturnal), gastrointestinal bleeding (melena, rectal bleeding), abdominal pain, significant heartburn, bowel changes, GU symptoms (dysuria, hematuria, incontinence), Gyn symptoms (abnormal  bleeding, pain),  syncope, focal weakness, memory loss, numbness & tingling, skin/hair/nail changes, abnormal bruising or bleeding, anxiety, or depression.   This visit occurred during the SARS-CoV-2 public health emergency.  Safety protocols were in place, including screening questions prior to the visit, additional usage of staff PPE, and extensive cleaning of exam room while observing appropriate contact time as indicated for disinfecting solutions.       Objective:   Physical Exam General Appearance:    Alert, cooperative, no distress, appears stated age  Head:    Normocephalic, without obvious abnormality, atraumatic  Eyes:    PERRL, conjunctiva/corneas clear, EOM's intact, fundi    benign, both eyes  Ears:    R TM perforation, otherwise WNL  Nose:   Deferred due to COVID  Throat:   Neck:   Supple,  symmetrical, trachea midline, no adenopathy;    Thyroid: no enlargement/tenderness/nodules  Back:     Symmetric, no curvature, ROM normal, no CVA tenderness  Lungs:     Clear to auscultation bilaterally, respirations unlabored  Chest Wall:    No tenderness or deformity   Heart:    Regular rate and rhythm, S1 and S2 normal, no murmur, rub   or gallop  Breast Exam:    Deferred to GYN  Abdomen:     Soft, non-tender, bowel sounds active all four quadrants,    no masses, no organomegaly  Genitalia:    Deferred to GYN  Rectal:    Extremities:   Extremities normal, atraumatic, no cyanosis or edema  Pulses:   2+ and symmetric all extremities  Skin:   Skin color, texture, turgor normal, no rashes or lesions  Lymph nodes:   Cervical, supraclavicular, and axillary nodes normal  Neurologic:   CNII-XII intact, normal strength, sensation and reflexes    throughout          Assessment & Plan:

## 2020-04-12 NOTE — Patient Instructions (Signed)
Follow up in 1 year or as needed No need for labs!!!  YAY!!! Keep up the good work on healthy diet and regular exercise- you look great! We'll call you with your GYN appt Please get your COVID shots! Call with any questions or concerns Stay Safe!  Stay Healthy! Happy Holidays!!!

## 2020-04-28 DIAGNOSIS — N760 Acute vaginitis: Secondary | ICD-10-CM | POA: Diagnosis not present

## 2020-04-28 DIAGNOSIS — Z124 Encounter for screening for malignant neoplasm of cervix: Secondary | ICD-10-CM | POA: Diagnosis not present

## 2020-04-28 DIAGNOSIS — N921 Excessive and frequent menstruation with irregular cycle: Secondary | ICD-10-CM | POA: Diagnosis not present

## 2020-04-28 DIAGNOSIS — Z1159 Encounter for screening for other viral diseases: Secondary | ICD-10-CM | POA: Diagnosis not present

## 2020-04-28 DIAGNOSIS — Z01419 Encounter for gynecological examination (general) (routine) without abnormal findings: Secondary | ICD-10-CM | POA: Diagnosis not present

## 2020-04-28 DIAGNOSIS — Z113 Encounter for screening for infections with a predominantly sexual mode of transmission: Secondary | ICD-10-CM | POA: Diagnosis not present

## 2020-04-28 DIAGNOSIS — Z114 Encounter for screening for human immunodeficiency virus [HIV]: Secondary | ICD-10-CM | POA: Diagnosis not present

## 2020-04-28 DIAGNOSIS — Z6824 Body mass index (BMI) 24.0-24.9, adult: Secondary | ICD-10-CM | POA: Diagnosis not present

## 2020-04-28 LAB — HM PAP SMEAR: HM Pap smear: NORMAL

## 2020-06-04 DIAGNOSIS — Z20822 Contact with and (suspected) exposure to covid-19: Secondary | ICD-10-CM | POA: Diagnosis not present

## 2020-06-04 DIAGNOSIS — R059 Cough, unspecified: Secondary | ICD-10-CM | POA: Diagnosis not present

## 2020-06-04 DIAGNOSIS — Z9189 Other specified personal risk factors, not elsewhere classified: Secondary | ICD-10-CM | POA: Diagnosis not present

## 2020-06-04 DIAGNOSIS — J019 Acute sinusitis, unspecified: Secondary | ICD-10-CM | POA: Diagnosis not present

## 2020-06-06 DIAGNOSIS — Z20822 Contact with and (suspected) exposure to covid-19: Secondary | ICD-10-CM | POA: Diagnosis not present

## 2020-06-06 DIAGNOSIS — R058 Other specified cough: Secondary | ICD-10-CM | POA: Diagnosis not present

## 2020-06-06 DIAGNOSIS — R519 Headache, unspecified: Secondary | ICD-10-CM | POA: Diagnosis not present

## 2020-06-06 DIAGNOSIS — R509 Fever, unspecified: Secondary | ICD-10-CM | POA: Diagnosis not present

## 2020-06-06 DIAGNOSIS — M791 Myalgia, unspecified site: Secondary | ICD-10-CM | POA: Diagnosis not present

## 2020-06-07 ENCOUNTER — Encounter: Payer: Self-pay | Admitting: Family Medicine

## 2020-06-07 ENCOUNTER — Other Ambulatory Visit: Payer: Self-pay | Admitting: Family Medicine

## 2020-06-07 ENCOUNTER — Telehealth (INDEPENDENT_AMBULATORY_CARE_PROVIDER_SITE_OTHER): Payer: BC Managed Care – PPO | Admitting: Family Medicine

## 2020-06-07 ENCOUNTER — Telehealth: Payer: Self-pay | Admitting: Family Medicine

## 2020-06-07 ENCOUNTER — Other Ambulatory Visit: Payer: Self-pay

## 2020-06-07 DIAGNOSIS — R509 Fever, unspecified: Secondary | ICD-10-CM

## 2020-06-07 MED ORDER — PREDNISONE 10 MG PO TABS: ORAL_TABLET | ORAL | 0 refills | Status: DC

## 2020-06-07 MED ORDER — PREDNISONE 10 MG PO TABS
ORAL_TABLET | ORAL | 0 refills | Status: DC
Start: 2020-06-07 — End: 2021-05-30

## 2020-06-07 NOTE — Progress Notes (Signed)
I connected with  Shannon Chan on 06/07/20 by a video enabled telemedicine application and verified that I am speaking with the correct person using two identifiers.   I discussed the limitations of evaluation and management by telemedicine. The patient expressed understanding and agreed to proceed.

## 2020-06-07 NOTE — Telephone Encounter (Signed)
Labs need to be changed from [facility]

## 2020-06-07 NOTE — Telephone Encounter (Signed)
Labs have been changed, signed by provider and faxed to [facility]

## 2020-06-07 NOTE — Addendum Note (Signed)
Addended by: Joylene Igo L on: 06/07/2020 02:49 PM   Modules accepted: Orders

## 2020-06-07 NOTE — Progress Notes (Signed)
   Virtual Visit via Video   I connected with patient on 06/07/20 at 12:30 PM EST by a video enabled telemedicine application and verified that I am speaking with the correct person using two identifiers.  Location patient: Home Location provider: [facility] Persons participating in the virtual visit: Patient, Provider, CMA (Sabrina M)  I discussed the limitations of evaluation and management by telemedicine and the availability of in person appointments. The patient expressed understanding and agreed to proceed.  Subjective:   HPI:   Fever- mom noted swollen cervical LN on Friday.  On Saturday developed fever to 102.3.  Went to UC on Sunday- did COVID and flu and both negative.  Temp was 103.  Started Augmentin for suspected sinus infxn.  Woke Tuesday AM and temp was 104.  Went to ER- UA negative, CXR negative, COVID/flu still negative.  Temp is now 101 alternating tylenol and ibuprofen.  Last voided 9pm last night.  Sweating profusely.  Started coughing last night.  Throat is swollen.  Now having severe back pain.  + fatigue.  Tonsils surgically removed.  Mom reports large L cervical LN.  ROS:   See pertinent positives and negatives per HPI.  Patient Active Problem List   Diagnosis Date Noted  . Conductive hearing loss of right ear with unrestricted hearing of left ear 04/12/2020  . Perforation of right tympanic membrane 04/12/2020  . Physical exam 04/08/2018  . Raynaud phenomenon 06/13/2015    Social History   Tobacco Use  . Smoking status: Never Smoker  . Smokeless tobacco: Never Used  Substance Use Topics  . Alcohol use: No    Alcohol/week: 0.0 standard drinks    Current Outpatient Medications:  .  amoxicillin-clavulanate (AUGMENTIN) 875-125 MG tablet, Take 1 tablet by mouth 2 (two) times daily., Disp: , Rfl:  .  MULTIPLE VITAMIN PO, Take 1 capsule by mouth daily., Disp: , Rfl:  .  norgestimate-ethinyl estradiol (SPRINTEC 28) 0.25-35 MG-MCG tablet, Take  1 tablet by mouth daily., Disp: 1 Package, Rfl: 11 .  erythromycin with ethanol (EMGEL) 2 % gel, Apply topically 2 (two) times daily. (Patient not taking: Reported on 06/07/2020), Disp: , Rfl:   Allergies  Allergen Reactions  . Cephalosporins     Objective:   There were no vitals taken for this visit. AAOx3, obviously not feeling well NCAT, EOMI No obvious CN deficits Coloring WNL Pt is able to speak clearly, coherently without shortness of breath or increased work of breathing.  Thought process is linear.  Mood is appropriate.   Assessment and Plan:   Fever- new.  sxs started on Saturday and temp has been consistently between 101-104 since onset.  UA, CXR, COVID and Flu negative.  Pt has had multiple negative COVID and flu tests.  Was started on Augmentin in case of sinus infxn but highly doubt this is bacterial.  Sxs are suspicious or either undiagnosed COVID or mono.  Will get CBC and mono labs to assess.  Stressed need for increased fluids and instructed mom to take her to the ER if not urinating b/c she will need fluids.  Will start prednisone due to swelling of throat- likely due to LAD.  Pt instructed to avoid ibuprofen while on the Prednisone and to take acetaminophen for fever control.  Reviewed supportive care and red flags that should prompt return.  Pt expressed understanding and is in agreement w/ plan.   Neena Rhymes, MD 06/07/2020

## 2020-06-08 ENCOUNTER — Encounter: Payer: Self-pay | Admitting: Family Medicine

## 2020-06-08 LAB — SPECIMEN STATUS REPORT

## 2020-06-09 LAB — CBC WITH DIFFERENTIAL/PLATELET
Basophils Absolute: 0 10*3/uL (ref 0.0–0.2)
Basos: 0 %
EOS (ABSOLUTE): 0 10*3/uL (ref 0.0–0.4)
Eos: 0 %
Hematocrit: 36.2 % (ref 34.0–46.6)
Hemoglobin: 12.4 g/dL (ref 11.1–15.9)
Immature Grans (Abs): 0 10*3/uL (ref 0.0–0.1)
Immature Granulocytes: 0 %
Lymphocytes Absolute: 1.4 10*3/uL (ref 0.7–3.1)
Lymphs: 12 %
MCH: 30.5 pg (ref 26.6–33.0)
MCHC: 34.3 g/dL (ref 31.5–35.7)
MCV: 89 fL (ref 79–97)
Monocytes Absolute: 1.3 10*3/uL — ABNORMAL HIGH (ref 0.1–0.9)
Monocytes: 11 %
Neutrophils Absolute: 9 10*3/uL — ABNORMAL HIGH (ref 1.4–7.0)
Neutrophils: 77 %
Platelets: 276 10*3/uL (ref 150–450)
RBC: 4.06 x10E6/uL (ref 3.77–5.28)
RDW: 12.4 % (ref 11.7–15.4)
WBC: 11.8 10*3/uL — ABNORMAL HIGH (ref 3.4–10.8)

## 2020-06-09 LAB — EPSTEIN-BARR VIRUS (EBV) ANTIBODY PROFILE
EBV NA IgG: 64.9 U/mL — ABNORMAL HIGH (ref 0.0–17.9)
EBV VCA IgG: 169 U/mL — ABNORMAL HIGH (ref 0.0–17.9)
EBV VCA IgM: <36 U/mL (ref 0.0–35.9)

## 2020-06-09 LAB — CMV IGM: CMV IgM Ser EIA-aCnc: <30 AU/mL (ref 0.0–29.9)

## 2020-06-21 DIAGNOSIS — H7201 Central perforation of tympanic membrane, right ear: Secondary | ICD-10-CM | POA: Diagnosis not present

## 2020-06-21 DIAGNOSIS — H9011 Conductive hearing loss, unilateral, right ear, with unrestricted hearing on the contralateral side: Secondary | ICD-10-CM | POA: Diagnosis not present

## 2020-06-29 ENCOUNTER — Ambulatory Visit: Payer: BC Managed Care – PPO | Admitting: Family Medicine

## 2020-10-06 DIAGNOSIS — Z20822 Contact with and (suspected) exposure to covid-19: Secondary | ICD-10-CM | POA: Diagnosis not present

## 2020-10-18 ENCOUNTER — Encounter: Payer: Self-pay | Admitting: *Deleted

## 2020-11-17 DIAGNOSIS — N911 Secondary amenorrhea: Secondary | ICD-10-CM | POA: Diagnosis not present

## 2020-11-17 DIAGNOSIS — N926 Irregular menstruation, unspecified: Secondary | ICD-10-CM | POA: Diagnosis not present

## 2021-01-03 DIAGNOSIS — Z713 Dietary counseling and surveillance: Secondary | ICD-10-CM | POA: Diagnosis not present

## 2021-01-13 DIAGNOSIS — Z713 Dietary counseling and surveillance: Secondary | ICD-10-CM | POA: Diagnosis not present

## 2021-01-15 DIAGNOSIS — Z713 Dietary counseling and surveillance: Secondary | ICD-10-CM | POA: Diagnosis not present

## 2021-01-22 DIAGNOSIS — Z713 Dietary counseling and surveillance: Secondary | ICD-10-CM | POA: Diagnosis not present

## 2021-01-29 DIAGNOSIS — Z713 Dietary counseling and surveillance: Secondary | ICD-10-CM | POA: Diagnosis not present

## 2021-02-05 DIAGNOSIS — Z713 Dietary counseling and surveillance: Secondary | ICD-10-CM | POA: Diagnosis not present

## 2021-02-09 DIAGNOSIS — Z9889 Other specified postprocedural states: Secondary | ICD-10-CM | POA: Diagnosis not present

## 2021-02-09 DIAGNOSIS — H9011 Conductive hearing loss, unilateral, right ear, with unrestricted hearing on the contralateral side: Secondary | ICD-10-CM | POA: Diagnosis not present

## 2021-02-19 DIAGNOSIS — Z713 Dietary counseling and surveillance: Secondary | ICD-10-CM | POA: Diagnosis not present

## 2021-03-05 DIAGNOSIS — Z713 Dietary counseling and surveillance: Secondary | ICD-10-CM | POA: Diagnosis not present

## 2021-03-12 DIAGNOSIS — L700 Acne vulgaris: Secondary | ICD-10-CM | POA: Diagnosis not present

## 2021-03-19 DIAGNOSIS — Z713 Dietary counseling and surveillance: Secondary | ICD-10-CM | POA: Diagnosis not present

## 2021-04-02 DIAGNOSIS — Z713 Dietary counseling and surveillance: Secondary | ICD-10-CM | POA: Diagnosis not present

## 2021-04-17 ENCOUNTER — Encounter: Payer: BC Managed Care – PPO | Admitting: Family Medicine

## 2021-04-23 DIAGNOSIS — Z713 Dietary counseling and surveillance: Secondary | ICD-10-CM | POA: Diagnosis not present

## 2021-05-07 DIAGNOSIS — L700 Acne vulgaris: Secondary | ICD-10-CM | POA: Diagnosis not present

## 2021-05-08 DIAGNOSIS — Z713 Dietary counseling and surveillance: Secondary | ICD-10-CM | POA: Diagnosis not present

## 2021-05-24 DIAGNOSIS — Z713 Dietary counseling and surveillance: Secondary | ICD-10-CM | POA: Diagnosis not present

## 2021-05-30 ENCOUNTER — Encounter: Payer: Self-pay | Admitting: Family Medicine

## 2021-05-30 ENCOUNTER — Ambulatory Visit (INDEPENDENT_AMBULATORY_CARE_PROVIDER_SITE_OTHER): Payer: BC Managed Care – PPO | Admitting: Family Medicine

## 2021-05-30 VITALS — BP 110/68 | HR 79 | Temp 98.6°F | Resp 16 | Ht 69.5 in | Wt 183.2 lb

## 2021-05-30 DIAGNOSIS — Z1159 Encounter for screening for other viral diseases: Secondary | ICD-10-CM

## 2021-05-30 DIAGNOSIS — E663 Overweight: Secondary | ICD-10-CM

## 2021-05-30 DIAGNOSIS — Z114 Encounter for screening for human immunodeficiency virus [HIV]: Secondary | ICD-10-CM

## 2021-05-30 DIAGNOSIS — Z0000 Encounter for general adult medical examination without abnormal findings: Secondary | ICD-10-CM

## 2021-05-30 LAB — BASIC METABOLIC PANEL
BUN: 15 mg/dL (ref 6–23)
CO2: 27 mEq/L (ref 19–32)
Calcium: 9.3 mg/dL (ref 8.4–10.5)
Chloride: 104 mEq/L (ref 96–112)
Creatinine, Ser: 0.75 mg/dL (ref 0.40–1.20)
GFR: 112.24 mL/min (ref >60.00)
Glucose, Bld: 78 mg/dL (ref 70–99)
Potassium: 3.9 mEq/L (ref 3.5–5.1)
Sodium: 140 mEq/L (ref 135–145)

## 2021-05-30 LAB — CBC WITH DIFFERENTIAL/PLATELET
Basophils Absolute: 0.1 10*3/uL (ref 0.0–0.1)
Basophils Relative: 0.6 % (ref 0.0–3.0)
Eosinophils Absolute: 0 10*3/uL (ref 0.0–0.7)
Eosinophils Relative: 0.3 % (ref 0.0–5.0)
HCT: 40.6 % (ref 36.0–46.0)
Hemoglobin: 13.5 g/dL (ref 12.0–15.0)
Lymphocytes Relative: 35.4 % (ref 12.0–46.0)
Lymphs Abs: 2.9 10*3/uL (ref 0.7–4.0)
MCHC: 33.3 g/dL (ref 30.0–36.0)
MCV: 89.1 fl (ref 78.0–100.0)
Monocytes Absolute: 0.5 10*3/uL (ref 0.1–1.0)
Monocytes Relative: 6.1 % (ref 3.0–12.0)
Neutro Abs: 4.6 10*3/uL (ref 1.4–7.7)
Neutrophils Relative %: 57.6 % (ref 43.0–77.0)
Platelets: 335 10*3/uL (ref 150.0–400.0)
RBC: 4.56 Mil/uL (ref 3.87–5.11)
RDW: 13.6 % (ref 11.5–15.5)
WBC: 8 10*3/uL (ref 4.0–10.5)

## 2021-05-30 LAB — LIPID PANEL
Cholesterol: 138 mg/dL (ref 0–200)
HDL: 52.4 mg/dL (ref >39.00)
LDL Cholesterol: 73 mg/dL (ref 0–99)
NonHDL: 85.3
Total CHOL/HDL Ratio: 3
Triglycerides: 63 mg/dL (ref 0.0–149.0)
VLDL: 12.6 mg/dL (ref 0.0–40.0)

## 2021-05-30 LAB — HEPATIC FUNCTION PANEL
ALT: 18 U/L (ref 0–35)
AST: 18 U/L (ref 0–37)
Albumin: 4.3 g/dL (ref 3.5–5.2)
Alkaline Phosphatase: 90 U/L (ref 39–117)
Bilirubin, Direct: 0.1 mg/dL (ref 0.0–0.3)
Total Bilirubin: 0.4 mg/dL (ref 0.2–1.2)
Total Protein: 6.7 g/dL (ref 6.0–8.3)

## 2021-05-30 LAB — TSH: TSH: 2.84 u[IU]/mL (ref 0.35–5.50)

## 2021-05-30 NOTE — Progress Notes (Signed)
° °  Subjective:    Patient ID: Shannon Chan, female    DOB: 1995-05-16, 26 y.o.   MRN: [id number]  HPI CPE- UTD on pap, Tdap.  Health Maintenance  Topic Date Due   HIV Screening  Never done   Hepatitis C Screening  Never done   PAP-Cervical Cytology Screening  Never done   PAP SMEAR-Modifier  Never done   INFLUENZA VACCINE  07/20/2021 (Originally 11/20/2020)   TETANUS/TDAP  04/11/2029   HPV VACCINES  Completed   COVID-19 Vaccine  Discontinued      Review of Systems Patient reports no vision/ hearing changes, adenopathy,fever, weight change,  persistant/recurrent hoarseness , swallowing issues, chest pain, palpitations, edema, persistant/recurrent cough, hemoptysis, dyspnea (rest/exertional/paroxysmal nocturnal), gastrointestinal bleeding (melena, rectal bleeding), abdominal pain, significant heartburn, bowel changes, GU symptoms (dysuria, hematuria, incontinence), Gyn symptoms (abnormal  bleeding, pain),  syncope, focal weakness, memory loss, numbness & tingling, hair/nail changes, bleeding, anxiety, or depression.   + finger swelling- occurs randomly, unable to make a fist when it happens.  It will be painful.  Rings will get stuck. + easy bruising + acne  This visit occurred during the SARS-CoV-2 public health emergency.  Safety protocols were in place, including screening questions prior to the visit, additional usage of staff PPE, and extensive cleaning of exam room while observing appropriate contact time as indicated for disinfecting solutions.      Objective:   Physical Exam General Appearance:    Alert, cooperative, no distress, appears stated age  Head:    Normocephalic, without obvious abnormality, atraumatic  Eyes:    PERRL, conjunctiva/corneas clear, EOM's intact, fundi    benign, both eyes  Ears:    Normal TM's and external ear canals, both ears  Nose:   Deferred due to COVID  Throat:   Neck:   Supple, symmetrical, trachea midline, no adenopathy;    Thyroid: no  enlargement/tenderness/nodules  Back:     Symmetric, no curvature, ROM normal, no CVA tenderness  Lungs:     Clear to auscultation bilaterally, respirations unlabored  Chest Wall:    No tenderness or deformity   Heart:    Regular rate and rhythm, S1 and S2 normal, no murmur, rub   or gallop  Breast Exam:    Deferred to GYN  Abdomen:     Soft, non-tender, bowel sounds active all four quadrants,    no masses, no organomegaly  Genitalia:    Deferred to GYN  Rectal:    Extremities:   Extremities normal, atraumatic, no cyanosis or edema  Pulses:   2+ and symmetric all extremities  Skin:   Skin color, texture, turgor normal, no rashes or lesions  Lymph nodes:   Cervical, supraclavicular, and axillary nodes normal  Neurologic:   CNII-XII intact, normal strength, sensation and reflexes    throughout          Assessment & Plan:

## 2021-05-30 NOTE — Patient Instructions (Signed)
Follow up in 1 year or as needed We'll notify you of your lab results and make any changes if needed INCREASE your water and salt intake Try the Spironolactone and see how it goes Let me know if the fingers keep swelling or doing weird things Keep up the good work on healthy diet and regular exercise- you look great!! Call with any questions or concerns Happy Valentine's Day!!!

## 2021-05-31 LAB — HIV ANTIBODY (ROUTINE TESTING W REFLEX): HIV 1&2 Ab, 4th Generation: NONREACTIVE

## 2021-05-31 LAB — HEPATITIS C ANTIBODY
Hepatitis C Ab: NONREACTIVE
SIGNAL TO CUT-OFF: 0.02 (ref <1.00)

## 2021-06-04 ENCOUNTER — Encounter: Payer: Self-pay | Admitting: Family Medicine

## 2021-06-04 DIAGNOSIS — Z713 Dietary counseling and surveillance: Secondary | ICD-10-CM | POA: Diagnosis not present

## 2021-06-04 NOTE — Assessment & Plan Note (Signed)
Pt's PE WNL.  UTD on pap, Tdap.  Check labs.  Anticipatory guidance provided.  

## 2021-06-05 ENCOUNTER — Encounter: Payer: Self-pay | Admitting: Family Medicine

## 2021-06-05 DIAGNOSIS — Z713 Dietary counseling and surveillance: Secondary | ICD-10-CM | POA: Diagnosis not present

## 2021-06-19 DIAGNOSIS — Z713 Dietary counseling and surveillance: Secondary | ICD-10-CM | POA: Diagnosis not present

## 2021-07-03 DIAGNOSIS — Z713 Dietary counseling and surveillance: Secondary | ICD-10-CM | POA: Diagnosis not present

## 2021-08-03 DIAGNOSIS — Z713 Dietary counseling and surveillance: Secondary | ICD-10-CM | POA: Diagnosis not present

## 2021-08-03 DIAGNOSIS — D2271 Melanocytic nevi of right lower limb, including hip: Secondary | ICD-10-CM | POA: Diagnosis not present

## 2021-08-03 DIAGNOSIS — L720 Epidermal cyst: Secondary | ICD-10-CM | POA: Diagnosis not present

## 2021-08-03 DIAGNOSIS — I8393 Asymptomatic varicose veins of bilateral lower extremities: Secondary | ICD-10-CM | POA: Diagnosis not present

## 2021-08-03 DIAGNOSIS — L700 Acne vulgaris: Secondary | ICD-10-CM | POA: Diagnosis not present

## 2021-08-24 DIAGNOSIS — Z713 Dietary counseling and surveillance: Secondary | ICD-10-CM | POA: Diagnosis not present

## 2021-09-07 DIAGNOSIS — Z713 Dietary counseling and surveillance: Secondary | ICD-10-CM | POA: Diagnosis not present

## 2021-09-26 ENCOUNTER — Encounter: Payer: Self-pay | Admitting: Family Medicine

## 2021-09-27 DIAGNOSIS — Z713 Dietary counseling and surveillance: Secondary | ICD-10-CM | POA: Diagnosis not present

## 2021-10-01 ENCOUNTER — Encounter: Payer: Self-pay | Admitting: Family Medicine

## 2021-10-01 ENCOUNTER — Telehealth (INDEPENDENT_AMBULATORY_CARE_PROVIDER_SITE_OTHER): Payer: BC Managed Care – PPO | Admitting: Family Medicine

## 2021-10-01 DIAGNOSIS — R5383 Other fatigue: Secondary | ICD-10-CM

## 2021-10-01 DIAGNOSIS — H538 Other visual disturbances: Secondary | ICD-10-CM

## 2021-10-01 DIAGNOSIS — R630 Anorexia: Secondary | ICD-10-CM | POA: Diagnosis not present

## 2021-10-01 NOTE — Progress Notes (Signed)
Virtual Visit via Video   I connected with patient on 10/01/21 at  2:20 PM EDT by a video enabled telemedicine application and verified that I am speaking with the correct person using two identifiers.  Location patient: Home Location provider: [facility] Persons participating in the virtual visit: Patient, Provider, CMA Sheryle Hail)  I discussed the limitations of evaluation and management by telemedicine and the availability of in person appointments. The patient expressed understanding and agreed to proceed.  Subjective:   HPI:   Fatigue- 'i've just been so exhausted.  Like beyond exhausted'.  Pt reports she is sleeping until she needs to leave for work and then when she gets home.  Pt feels like she's 'always been a tired person' but things have worsened over the past 2 weeks.  Is now feeling 'foggy headed', is having issues w/ jumbling words.  Pt denies any changes recently- no changes in medications, mood.  Reports she wants to do things and has interest in things but she just doesn't have the energy.  Pt reports sleeping well at night.  Pt does not snore at night that she is aware of.  She reports that sometimes she feels she could nod off at work but has not fallen asleep in a strange or otherwise dangerous situation  Loss of appetite- 'nothing sounds good and sometimes I get a little nauseous'.  Seeing a nutritionist who is encouraging her to eat for energy.  Denies acid reflux or indigestion.  Blurry vision- L eye.  Has been told she has dry eye.  Pt has seen eye doctor and has been told vision is 'fine'.  Has eye drops but she doesn't use them.  ROS:   See pertinent positives and negatives per HPI.  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 AAOx3, NAD NCAT, EOMI No obvious CN deficits Coloring WNL Pt is able to speak clearly, coherently without shortness of breath or increased work of breathing.  Thought process is linear.  Mood is appropriate.   Assessment and Plan:   Fatigue- deteriorated.  Pt reports she has always been a 'sleepy' person and more tired than others' but sxs have become much more notable in the past 2 weeks.  She reports absolutely no energy, daytime sleepiness, inability to participate in things she wants to due to level of fatigue.  She feels like she is sleeping ok at night and to her knowledge, does not snore.  Will check labs to determine metabolic or rheumatologic cause.  Will refer to sleep specialist to r/o OSA, narcolepsy, or other sleep disorder.  Also discussed that this may be emotional exhaustion and a manifestation of depression, but we'll revisit that if the work up is unrevealing.  Pt expressed understanding and is in agreement w/ plan.  Loss of appetite- new.  Pt is  already working w/ a nutritionist but it sounds almost as if she is forcing herself to eat.  Again question whether this is mood related or if there is an underlying process.  She mentioned this in passing today but worth noting as we piece together what is going on.  Blurry vision- L eye.  Intermittent.  Pt has had an eye exam and has been told she has dry eye.  She is not using the drops b/c her eyes are not itchy or burning.  Explained that they don't necessarily have to itch or burn but the dryness can cause a film that causes blurry vision.  Encouraged  her to restart eye drops.  Pt expressed understanding and is in agreement w/ plan.    Neena Rhymes, MD 10/01/2021

## 2021-10-02 ENCOUNTER — Telehealth: Payer: Self-pay | Admitting: Family Medicine

## 2021-10-02 ENCOUNTER — Encounter: Payer: Self-pay | Admitting: Family Medicine

## 2021-10-02 NOTE — Telephone Encounter (Signed)
Pt stats she live in [place]. Pt find a [facility] closer to her and their fax number is [phone].

## 2021-10-02 NOTE — Telephone Encounter (Signed)
error 

## 2021-10-02 NOTE — Telephone Encounter (Signed)
Printed orders and faxed to [facility] [phone]

## 2021-10-02 NOTE — Telephone Encounter (Signed)
Print the future orders and fax to requested number please

## 2021-10-06 ENCOUNTER — Encounter: Payer: Self-pay | Admitting: Family Medicine

## 2021-10-06 DIAGNOSIS — R5383 Other fatigue: Secondary | ICD-10-CM

## 2021-10-11 NOTE — Telephone Encounter (Signed)
Yes ma'm :)

## 2021-10-18 DIAGNOSIS — Z713 Dietary counseling and surveillance: Secondary | ICD-10-CM | POA: Diagnosis not present

## 2021-10-24 DIAGNOSIS — R110 Nausea: Secondary | ICD-10-CM | POA: Diagnosis not present

## 2021-10-24 DIAGNOSIS — R5383 Other fatigue: Secondary | ICD-10-CM | POA: Diagnosis not present

## 2021-10-24 DIAGNOSIS — R002 Palpitations: Secondary | ICD-10-CM | POA: Diagnosis not present

## 2021-10-24 DIAGNOSIS — R42 Dizziness and giddiness: Secondary | ICD-10-CM | POA: Diagnosis not present

## 2021-11-16 DIAGNOSIS — F341 Dysthymic disorder: Secondary | ICD-10-CM | POA: Diagnosis not present

## 2021-11-16 DIAGNOSIS — F419 Anxiety disorder, unspecified: Secondary | ICD-10-CM | POA: Diagnosis not present

## 2021-11-16 DIAGNOSIS — R42 Dizziness and giddiness: Secondary | ICD-10-CM | POA: Diagnosis not present

## 2021-11-16 DIAGNOSIS — R002 Palpitations: Secondary | ICD-10-CM | POA: Diagnosis not present

## 2021-11-19 DIAGNOSIS — Z713 Dietary counseling and surveillance: Secondary | ICD-10-CM | POA: Diagnosis not present

## 2021-11-30 DIAGNOSIS — Z713 Dietary counseling and surveillance: Secondary | ICD-10-CM | POA: Diagnosis not present

## 2021-12-04 DIAGNOSIS — F419 Anxiety disorder, unspecified: Secondary | ICD-10-CM | POA: Diagnosis not present

## 2021-12-04 DIAGNOSIS — F341 Dysthymic disorder: Secondary | ICD-10-CM | POA: Diagnosis not present

## 2021-12-04 DIAGNOSIS — Z973 Presence of spectacles and contact lenses: Secondary | ICD-10-CM | POA: Diagnosis not present

## 2021-12-21 DIAGNOSIS — L700 Acne vulgaris: Secondary | ICD-10-CM | POA: Diagnosis not present

## 2021-12-27 DIAGNOSIS — Z713 Dietary counseling and surveillance: Secondary | ICD-10-CM | POA: Diagnosis not present

## 2022-01-10 DIAGNOSIS — Z713 Dietary counseling and surveillance: Secondary | ICD-10-CM | POA: Diagnosis not present

## 2022-02-05 DIAGNOSIS — Z713 Dietary counseling and surveillance: Secondary | ICD-10-CM | POA: Diagnosis not present

## 2022-02-26 DIAGNOSIS — Z713 Dietary counseling and surveillance: Secondary | ICD-10-CM | POA: Diagnosis not present

## 2022-05-31 ENCOUNTER — Encounter: Payer: BC Managed Care – PPO | Admitting: Family Medicine

## 2022-07-10 ENCOUNTER — Encounter: Payer: Self-pay | Admitting: Family Medicine
